# Patient Record
Sex: Female | Born: 2000 | Race: Black or African American | Hispanic: No | Marital: Single | Smoking: Never smoker
Health system: Southern US, Community
[De-identification: ages and names within clinical notes are randomized; demographics above are authoritative.]

## PROBLEM LIST (undated history)

## (undated) ENCOUNTER — Emergency Department (HOSPITAL_COMMUNITY): Admission: EM | Payer: No Typology Code available for payment source | Source: Home / Self Care

## (undated) DIAGNOSIS — J302 Other seasonal allergic rhinitis: Secondary | ICD-10-CM

---
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
## 2018-11-08 IMAGING — DX DG FOOT 2V*R*
2 series · 2 of 2 positions shown · non-contrast
Comparison: None.

CLINICAL DATA: Laceration in heel of foot.

EXAM:
RIGHT FOOT - 2 VIEW

[foot ap]
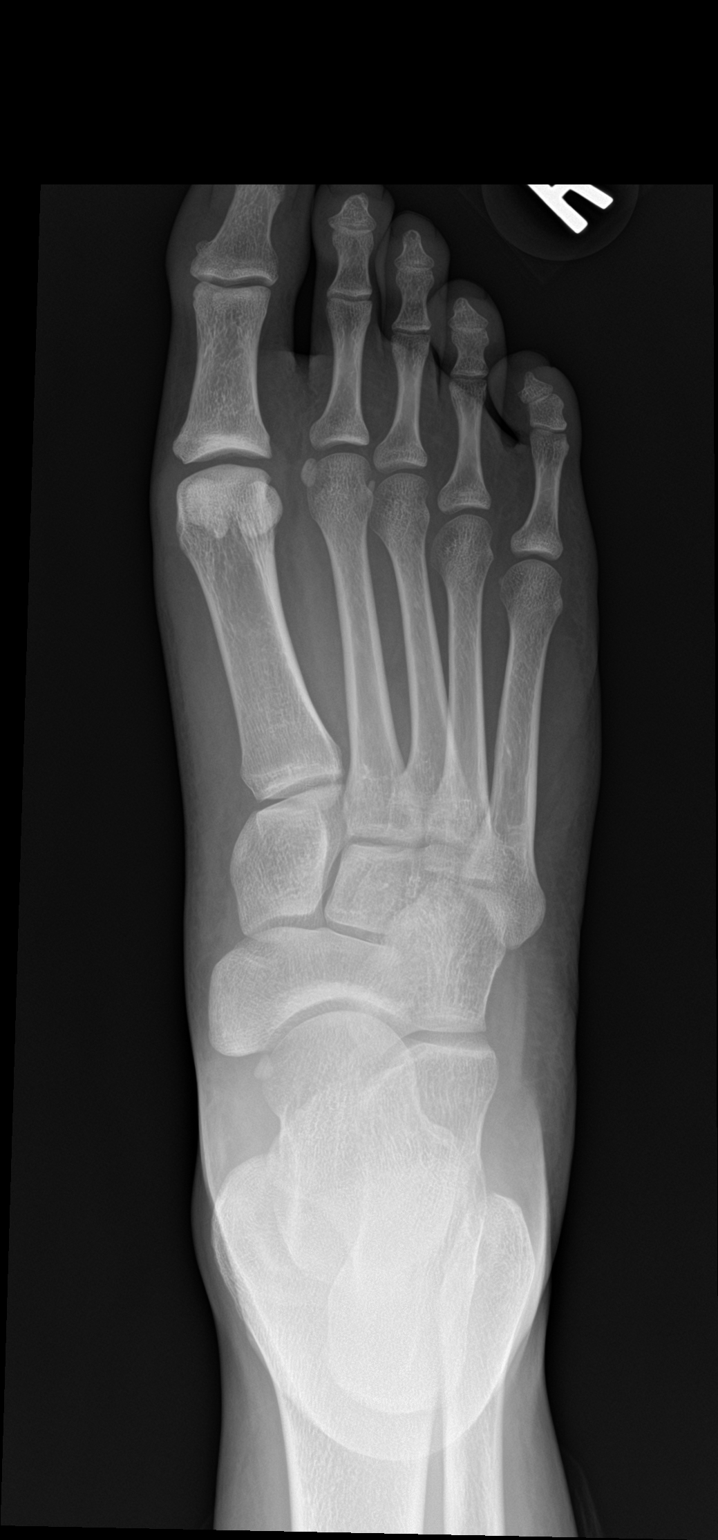

[foot lat]
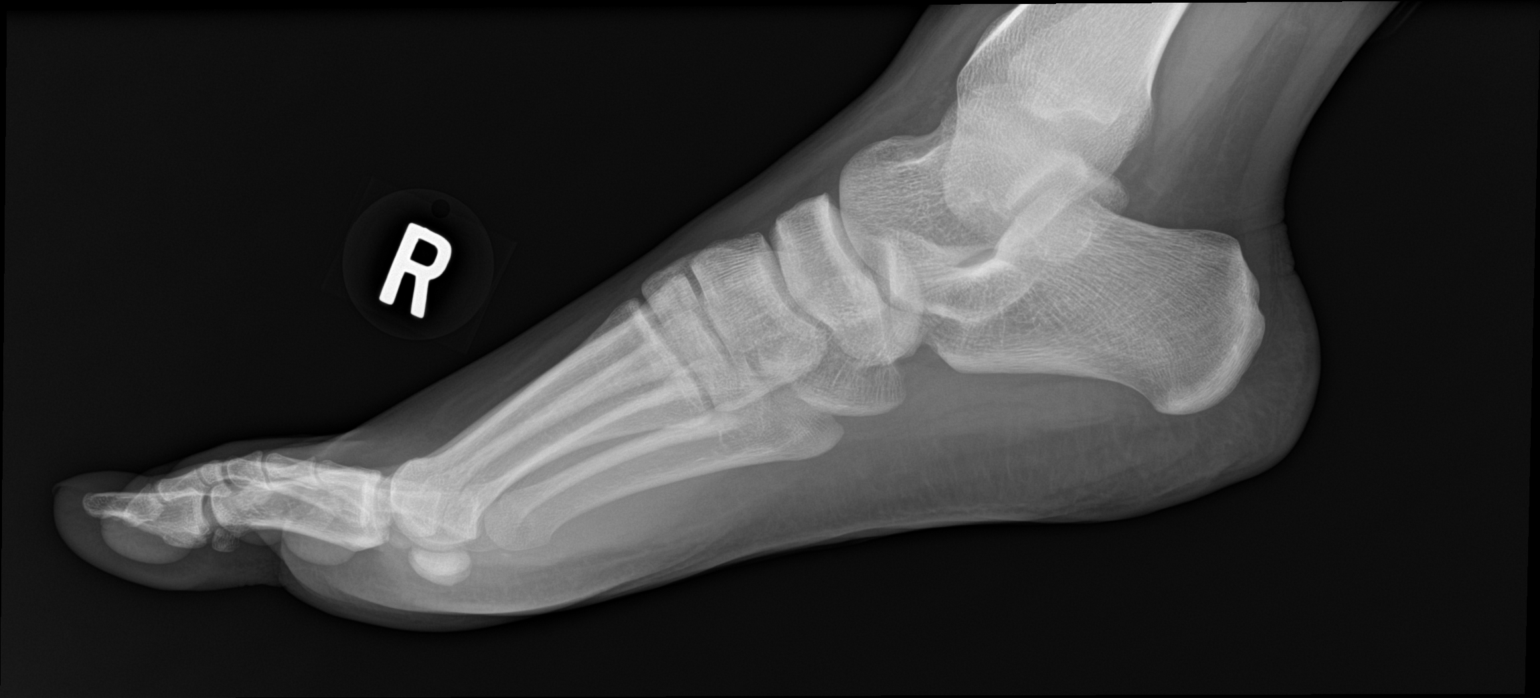

[2 of 2 positions shown; findings below may reference images not displayed]

FINDINGS: There is no evidence of fracture or dislocation. There is no
evidence of arthropathy or other focal bone abnormality. Soft
tissues are unremarkable.
IMPRESSION: Negative.

## 2019-01-07 ENCOUNTER — Emergency Department (HOSPITAL_BASED_OUTPATIENT_CLINIC_OR_DEPARTMENT_OTHER)
Admission: EM | Admit: 2019-01-07 | Discharge: 2019-01-07 | Disposition: A | Discharge status: Home / Self Care | Payer: BC Managed Care – PPO | Attending: Emergency Medicine | Admitting: Emergency Medicine

## 2019-01-07 ENCOUNTER — Encounter (HOSPITAL_BASED_OUTPATIENT_CLINIC_OR_DEPARTMENT_OTHER): Payer: Self-pay | Admitting: *Deleted

## 2019-01-07 ENCOUNTER — Other Ambulatory Visit: Payer: Self-pay

## 2019-01-07 DIAGNOSIS — R070 Pain in throat: Secondary | ICD-10-CM | POA: Diagnosis present

## 2019-01-07 DIAGNOSIS — J351 Hypertrophy of tonsils: Secondary | ICD-10-CM | POA: Diagnosis not present

## 2019-01-07 DIAGNOSIS — Z79899 Other long term (current) drug therapy: Secondary | ICD-10-CM | POA: Insufficient documentation

## 2019-01-07 MED ORDER — CETIRIZINE HCL 5 MG/5ML PO SOLN: 10.0000 mg | Freq: Every day | ORAL | 0 refills | Status: AC

## 2019-01-07 NOTE — ED Provider Notes (Signed)
[place] [facility] Provider Note   CSN: [id number] Arrival date & time: 01/07/19  1814     History   Chief Complaint Chief Complaint  Patient presents with  . Sore Throat    HPI Amy Potter is a 18 y.o. female.     HPI Patient states she is had several weeks of throat " tightness".  Denies any pain.  No difficulty swallowing.  No fever or chills.  Denies nasal sinus congestion.  No headaches.  No rashes. Past Medical History:  Diagnosis Date  . Seasonal allergies     Patient Active Problem List   Diagnosis Date Noted  . Migraine without aura 05/27/2013  . Tension headache 05/27/2013    History reviewed. No pertinent surgical history.   OB History   No obstetric history on file.      Home Medications    Prior to Admission medications   Medication Sig Start Date End Date Taking? Authorizing Provider  acetaminophen (TYLENOL) 500 MG tablet Take 500 mg by mouth every 6 (six) hours as needed.    [provider]  amitriptyline (ELAVIL) 25 MG tablet Take 1 tablet (25 mg total) by mouth at bedtime. 05/27/13   Teressa Lower, MD  cetirizine HCl (ZYRTEC) 5 MG/5ML SOLN Take 10 mLs (10 mg total) by mouth daily. 01/07/19   Julianne Rice, MD  Magnesium Oxide 500 MG TABS Take by mouth.    [provider]  Melatonin 3 MG TABS Take by mouth.    [provider]  riboflavin (VITAMIN B-2) 100 MG TABS tablet Take 100 mg by mouth daily.    [provider]    Family History Family History  Problem Relation Age of Onset  . Migraines Maternal Grandmother   . Depression Maternal Grandmother   . Dementia Maternal Grandmother   . Diabetes Paternal Grandmother   . Heart disease Paternal Grandfather   . Dementia Other     Social History Social History   Tobacco Use  . Smoking status: Never Smoker  . Smokeless tobacco: Never Used  Substance Use Topics  . Alcohol use: No  . Drug use: No     Allergies    Patient has no known allergies.   Review of Systems Review of Systems  Constitutional: Negative for chills and fever.  HENT: Negative for congestion, sinus pressure, sore throat, trouble swallowing and voice change.   Eyes: Negative for visual disturbance.  Respiratory: Negative for shortness of breath and stridor.   Musculoskeletal: Negative for neck pain and neck stiffness.  Skin: Negative for rash.  Neurological: Negative for headaches.  All other systems reviewed and are negative.    Physical Exam Updated Vital Signs BP 135/88   Pulse 96   Temp 98.2 F (36.8 C)   Resp 18   Ht 5\' 7"  (1.702 m)   Wt 63.5 kg   LMP 12/20/2018   SpO2 100%   BMI 21.93 kg/m   Physical Exam Vitals signs and nursing note reviewed.  Constitutional:      General: She is not in acute distress.    Appearance: Normal appearance. She is well-developed. She is not ill-appearing.  HENT:     Head: Normocephalic and atraumatic.     Nose: Nose normal. No congestion.     Mouth/Throat:     Mouth: Mucous membranes are moist.     Pharynx: No oropharyngeal exudate or posterior oropharyngeal erythema.     Comments: Bilateral tonsillar hypertrophy without erythema or exudates.  Uvula is midline. Eyes:     Extraocular Movements: Extraocular movements intact.     Pupils: Pupils are equal, round, and reactive to light.  Neck:     Musculoskeletal: Normal range of motion and neck supple. No neck rigidity or muscular tenderness.  Cardiovascular:     Rate and Rhythm: Normal rate.  Pulmonary:     Effort: Pulmonary effort is normal.     Breath sounds: Normal breath sounds.  Abdominal:     Palpations: Abdomen is soft.  Musculoskeletal: Normal range of motion.        General: No tenderness.  Lymphadenopathy:     Cervical: No cervical adenopathy.  Skin:    General: Skin is warm and dry.     Capillary Refill: Capillary refill takes less than 2 seconds.     Findings: No erythema or rash.  Neurological:      General: No focal deficit present.     Mental Status: She is alert and oriented to person, place, and time.  Psychiatric:        Behavior: Behavior normal.      ED Treatments / Results  Labs (all labs ordered are listed, but only abnormal results are displayed) Labs Reviewed - No data to display  EKG None  Radiology No results found.  Procedures Procedures (including critical care time)  Medications Ordered in ED Medications - No data to display   Initial Impression / Assessment and Plan / ED Course  I have reviewed the triage vital signs and the nursing notes.  Pertinent labs & imaging results that were available during my care of the patient were reviewed by me and considered in my medical decision making (see chart for details).        Patient has history of seasonal allergy and has been on Zyrtec in the past.  She is no longer taking this.  Allergies may be responsible for her symptoms.  Will place back on Zyrtec.  Return precautions given. Final Clinical Impressions(s) / ED Diagnoses   Final diagnoses:  Tonsillar hypertrophy    ED Discharge Orders         Ordered    cetirizine HCl (ZYRTEC) 5 MG/5ML SOLN  Daily     01/07/19 1837           Loren Racer, MD 01/07/19 1846

## 2019-01-07 NOTE — ED Triage Notes (Signed)
Pt c/o " throat tightness" x 3 weeks
